# Patient Record
Sex: Male | Born: 1990 | Race: White | Hispanic: No | Marital: Married | Smoking: Never smoker
Health system: Southern US, Community
[De-identification: ages and names within clinical notes are randomized; demographics above are authoritative.]

## PROBLEM LIST (undated history)

## (undated) DIAGNOSIS — Z789 Other specified health status: Secondary | ICD-10-CM

## (undated) HISTORY — PX: APPENDECTOMY: SHX54

---
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
## 2010-03-09 ENCOUNTER — Inpatient Hospital Stay (HOSPITAL_COMMUNITY): Admission: EM | Admit: 2010-03-09 | Discharge: 2010-03-10 | Payer: Self-pay | Admitting: Emergency Medicine

## 2010-03-23 ENCOUNTER — Ambulatory Visit (HOSPITAL_COMMUNITY): Admission: EM | Admit: 2010-03-23 | Discharge: 2010-03-23 | Payer: Self-pay

## 2010-03-24 ENCOUNTER — Encounter (INDEPENDENT_AMBULATORY_CARE_PROVIDER_SITE_OTHER): Payer: Self-pay

## 2010-07-11 LAB — COMPREHENSIVE METABOLIC PANEL
ALT: 10 U/L (ref 0–53)
AST: 34 U/L (ref 0–37)
Albumin: 4.4 g/dL (ref 3.5–5.2)
BUN: 11 mg/dL (ref 6–23)
BUN: 9 mg/dL (ref 6–23)
CO2: 28 mEq/L (ref 19–32)
Calcium: 9.5 mg/dL (ref 8.4–10.5)
Calcium: 9.7 mg/dL (ref 8.4–10.5)
Chloride: 105 mEq/L (ref 96–112)
Creatinine, Ser: 1.07 mg/dL (ref 0.4–1.5)
GFR calc Af Amer: >60 mL/min (ref >60)
GFR calc non Af Amer: >60 mL/min (ref >60)
Glucose, Bld: 113 mg/dL — ABNORMAL HIGH (ref 70–99)
Sodium: 138 mEq/L (ref 135–145)
Total Bilirubin: 0.8 mg/dL (ref 0.3–1.2)
Total Protein: 7.3 g/dL (ref 6.0–8.3)

## 2010-07-11 LAB — CBC
HCT: 40 % (ref 39.0–52.0)
MCH: 32.2 pg (ref 26.0–34.0)
MCH: 32.5 pg (ref 26.0–34.0)
MCHC: 35.7 g/dL (ref 30.0–36.0)
MCHC: 36.5 g/dL — ABNORMAL HIGH (ref 30.0–36.0)
MCHC: 36.5 g/dL — ABNORMAL HIGH (ref 30.0–36.0)
MCV: 89.1 fL (ref 78.0–100.0)
MCV: 90.1 fL (ref 78.0–100.0)
Platelets: 151 10*3/uL (ref 150–400)
Platelets: 176 10*3/uL (ref 150–400)
Platelets: 196 10*3/uL (ref 150–400)
RBC: 4.35 MIL/uL (ref 4.22–5.81)
RBC: 4.95 MIL/uL (ref 4.22–5.81)
RDW: 12.3 % (ref 11.5–15.5)
RDW: 12.6 % (ref 11.5–15.5)

## 2010-07-11 LAB — URINALYSIS, ROUTINE W REFLEX MICROSCOPIC
Bilirubin Urine: NEGATIVE
Glucose, UA: NEGATIVE mg/dL
Hgb urine dipstick: NEGATIVE
Hgb urine dipstick: NEGATIVE
Ketones, ur: NEGATIVE mg/dL
Ketones, ur: NEGATIVE mg/dL
Nitrite: NEGATIVE
Protein, ur: NEGATIVE mg/dL
Protein, ur: NEGATIVE mg/dL
Urobilinogen, UA: 0.2 mg/dL (ref 0.0–1.0)

## 2010-07-11 LAB — URINE CULTURE
Colony Count: NO GROWTH
Culture  Setup Time: 201111241035

## 2010-07-11 LAB — POCT I-STAT, CHEM 8
Creatinine, Ser: 1.2 mg/dL (ref 0.4–1.5)
Glucose, Bld: 98 mg/dL (ref 70–99)
HCT: 48 % (ref 39.0–52.0)
Hemoglobin: 16.3 g/dL (ref 13.0–17.0)
TCO2: 27 mmol/L (ref 0–100)

## 2010-07-11 LAB — DIFFERENTIAL
Lymphs Abs: 1.2 10*3/uL (ref 0.7–4.0)
Monocytes Relative: 6 % (ref 3–12)
Neutro Abs: 13.6 10*3/uL — ABNORMAL HIGH (ref 1.7–7.7)
Neutrophils Relative %: 86 % — ABNORMAL HIGH (ref 43–77)

## 2010-07-11 LAB — BASIC METABOLIC PANEL
BUN: 5 mg/dL — ABNORMAL LOW (ref 6–23)
CO2: 25 mEq/L (ref 19–32)
Chloride: 103 mEq/L (ref 96–112)
Creatinine, Ser: 0.97 mg/dL (ref 0.4–1.5)
Glucose, Bld: 120 mg/dL — ABNORMAL HIGH (ref 70–99)

## 2010-07-11 LAB — LACTIC ACID, PLASMA
Lactic Acid, Venous: 1.3 mmol/L (ref 0.5–2.2)
Lactic Acid, Venous: 1.7 mmol/L (ref 0.5–2.2)

## 2010-07-11 LAB — SAMPLE TO BLOOD BANK

## 2010-07-11 LAB — PROTIME-INR: INR: 1 (ref 0.00–1.49)

## 2010-09-15 NOTE — Op Note (Signed)
NAMEAMIR, FICK              ACCOUNT NO.:  [id number]   MEDICAL RECORD NO.:  [id number]          PATIENT TYPE:  OIB   LOCATION:  2899                         FACILITY:  [facility]   PHYSICIAN:  Lanna Poche, M.D. 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   OPERATION PERFORMED:  Scleral buckle, drainage of subretinal fluid and  laser.   SURGEON:  Lanna Poche, M.D.   ASSISTANT:  1.  Albert L. Lundquist, P.A.  2.  Chari Manning.   ANESTHESIA:  General endotracheal.   ESTIMATED BLOOD LOSS:  Less than 1mL.   COMPLICATIONS:  None.   DESCRIPTION OF PROCEDURE:  The patient was taken to the operating room where  after induction of general anesthesia, the right eye was prepped and draped  in usual fashion.  A 360 degree conjunctival peritomy was performed at the  limbus with relaxing incisions at 3 and 9.  Four rectus muscles were  isolated and slung on traction sutures of 2-0 silk.  The quadrants were  inspected and found to be free of significant thinning.  Inspection with the  indirect ophthalmoscope confirmed there to be a retinal disinsertion at the  ora serrata at approximately 10 o'clock.  No additional breaks or tears were  seen and the detachment was as had been noted preoperatively encompassing  approximately two thirds of the retina with some subretinal bands nasally.  The retinal break was marked and was found to lie right adjacent to the  insertion of the superior border of the lateral rectus muscle.  An indirect  laser was used for scleral depression to perform laser photocoagulation  around the breaks as well as scattered retinal photocoagulation just  immediately posterior to the ora serrata.  The lid speculum was then removed  and two 4-0 silk traction sutures were placed, one on the upper and one on  the lower lid.  A 220 band was passed with a 240 strap, 360 degrees around  the eye with the ends joining in the superonasal quadrant with a 270 sleeve.  Sutures of 4-0 Mersilene were passed in a mattress fashion, two in each  quadrant with the exception of the supranasal quadrant where just one was  passed over the end of the buckle.  The eye was inspected and found to be  without hemorrhage with good indentation where the scleral buckle had been  loosely tied.  A quadrant was selected for drainage infratemporally.  The  buckles were retracted out of the bed and the Grieshaber sharp blade used to  incise down to the choroid.  The edges of the incision were then  diathermized lightly and then a suture of 7-0  Vicryl passed through that  area.  Using endolaser probe, penetration to the subretinal space was made  through the choroid.  Clear fluid had drained for  a few minutes and stopped  spontaneously.  The 7-0 Vicryl suture was then tied as well as the snugging  up of the sutures in the temporal quadrants.  Inspection with ophthalmoscope  showed to be drainage of most of the subretinal fluid, good approximation of  the laser treated retinal on the buckle as well as some retina posterior to  that.  Addtional laser photocoagulation was then applied to the surface of  the buckle 360 degrees.  The knots were then all permanently tied and  rotated posteriorly.  The edges of the 240 band were snugged up tightly and  then trimmed.  The traction sutures of 2-0 and 4-0 silk were then removed  and the lid speculum reintroduced.  The conjunctiva was then drawn up and  reapproximated with interrupted and running sutures of 6-0 plain gut.  The  subconjunctival space was irrigated with a mixture of antibiotic solution as  well as 0.75% Marcaine.  The  subconjunctival area was then injected with 100  mg of ceftazidime and 10 mg of Decadron.  The lid speculum was then removed.  The pressure was checked with a Barraquer tonometer and found to lie at  about 21.  Mixed antibiotic ointment was then applied on the patient's right  eye.  An eye patch and shield were then placed on the patient's eye.  Upon  waking from anesthesia, the patient left the operating room in stable  condition.      JTH/MEDQ  D:  07/05/2004  T:  07/05/2004  Job:  [id number]
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
## 2014-11-16 ENCOUNTER — Other Ambulatory Visit: Payer: Self-pay | Admitting: Orthopedic Surgery

## 2014-11-16 DIAGNOSIS — R531 Weakness: Secondary | ICD-10-CM

## 2014-11-16 DIAGNOSIS — R52 Pain, unspecified: Secondary | ICD-10-CM

## 2014-11-16 DIAGNOSIS — R609 Edema, unspecified: Secondary | ICD-10-CM

## 2014-11-19 ENCOUNTER — Other Ambulatory Visit: Payer: Self-pay

## 2018-06-18 ENCOUNTER — Other Ambulatory Visit: Payer: Self-pay | Admitting: Orthopedic Surgery

## 2018-06-20 NOTE — Pre-Procedure Instructions (Addendum)
Fadil Keysor  06/20/2018      [id number] - [place], [place] - [address] [place] [address] Phone: [phone] Fax: [phone]    Your procedure is scheduled on 06-25-2018  Wednesday   Report to [facility] Admitting at 7:30 A.M.   Call this number if you have problems the morning of surgery:  [phone]   Remember:  Do not eat food or drink liquids after midnight. :                        Take these medicines the morning of surgery with A SIP OF WATER Tramadol(Ultram) if needed  STOP TAKING ANY ASPIRIN (UNLESS OTHERWISE INSTRUCTED BY YOUR SURGEON),ANTIINFLAMATORIES (IBUPROFEN,ALEVE,MOTRIN,ADVIL,GOODY'S POWDERS),HERBAL SUPPLEMENTS,FISH OIL,AND VITAMINS 5-7 DAYS PRIOR TO SURGERY. Stop Mobic    Do not wear jewelry    Do not wear lotions, powders, or perfumes, or deodorant.   Do not shave 48 hours prior to surgery.  Men may shave face and neck.  Do not bring valuables to the hospital.  [facility] is not responsible for any belongings or valuables.  Contacts, dentures or bridgework may not be worn into surgery.  Leave your suitcase in the car.  After surgery it may be brought to your room.  For patients admitted to the hospital, discharge time will be determined by your treatment team.  Patients discharged the day of surgery will not be allowed to drive home.     [facility] - Preparing for Surgery  Before surgery, you can play an important role.  Because skin is not sterile, your skin needs to be as free of germs as possible.  You can reduce the number of germs on you skin by washing with CHG (chlorahexidine gluconate) soap before surgery.  CHG is an antiseptic cleaner which kills germs and bonds with the skin to continue killing germs even after washing.  Oral Hygiene is also important in reducing the risk of infection.  Remember to brush your teeth with your regular  toothpaste the morning of surgery.  Please DO NOT use if you have an allergy to CHG or antibacterial soaps.  If your skin becomes reddened/irritated stop using the CHG and inform your nurse when you arrive at Short Stay.  Do not shave (including legs and underarms) for at least 48 hours prior to the first CHG shower.  You may shave your face.  Please follow these instructions carefully:   1.  Shower with CHG Soap the night before surgery and the morning of Surgery.  2.  If you choose to wash your hair, wash your hair first as usual with your normal shampoo.  3.  After you shampoo, rinse your hair and body thoroughly to remove the shampoo. 4.  Use CHG as you would any other liquid soap.  You can apply chg directly to the skin and wash gently with a      scrungie or washcloth.           5.  Apply the CHG Soap to your body ONLY FROM THE NECK DOWN.   Do not use on open wounds or open sores. Avoid contact with your eyes, ears, mouth and genitals (private parts).  Wash genitals (private parts) with your normal soap.  6.  Wash thoroughly, paying special attention to the area where your surgery will be performed.  7.  Thoroughly rinse your body with warm water from the neck down.  8.  DO NOT shower/wash with your normal soap after using and rinsing off the CHG Soap.  9.  Pat yourself dry with a clean towel.            10.  Wear clean pajamas.            11.  Place clean sheets on your bed the night of your first shower and do not sleep with pets.  Day of Surgery  Do not apply any lotions/deoderants the morning of surgery.   Please wear clean clothes to the hospital/surgery center. Remember to brush your teeth with toothpaste.    Please read over the following fact sheets that you were given. Pain Booklet, Coughing and Deep Breathing and Surgical Site Infection Prevention

## 2018-06-23 ENCOUNTER — Encounter (HOSPITAL_COMMUNITY)
Admission: RE | Admit: 2018-06-23 | Discharge: 2018-06-23 | Disposition: A | Discharge status: Home / Self Care | Payer: BLUE CROSS/BLUE SHIELD | Source: Ambulatory Visit | Attending: Orthopedic Surgery | Admitting: Orthopedic Surgery

## 2018-06-23 ENCOUNTER — Encounter (HOSPITAL_COMMUNITY): Payer: Self-pay

## 2018-06-23 DIAGNOSIS — Z01812 Encounter for preprocedural laboratory examination: Secondary | ICD-10-CM

## 2018-06-23 DIAGNOSIS — M5416 Radiculopathy, lumbar region: Secondary | ICD-10-CM

## 2018-06-23 DIAGNOSIS — M79605 Pain in left leg: Secondary | ICD-10-CM | POA: Diagnosis present

## 2018-06-23 DIAGNOSIS — Z791 Long term (current) use of non-steroidal anti-inflammatories (NSAID): Secondary | ICD-10-CM | POA: Diagnosis not present

## 2018-06-23 DIAGNOSIS — M5116 Intervertebral disc disorders with radiculopathy, lumbar region: Secondary | ICD-10-CM | POA: Diagnosis not present

## 2018-06-23 HISTORY — DX: Other specified health status: Z78.9

## 2018-06-23 LAB — CBC WITH DIFFERENTIAL/PLATELET
Abs Immature Granulocytes: 0.01 10*3/uL (ref 0.00–0.07)
BASOS PCT: 0 %
Basophils Absolute: 0 10*3/uL (ref 0.0–0.1)
Eosinophils Absolute: 0 10*3/uL (ref 0.0–0.5)
Eosinophils Relative: 0 %
HCT: 44.9 % (ref 39.0–52.0)
Hemoglobin: 16 g/dL (ref 13.0–17.0)
Immature Granulocytes: 0 %
Lymphocytes Relative: 30 %
Lymphs Abs: 1.6 10*3/uL (ref 0.7–4.0)
MCH: 31.3 pg (ref 26.0–34.0)
MCHC: 35.6 g/dL (ref 30.0–36.0)
MCV: 87.7 fL (ref 80.0–100.0)
Monocytes Absolute: 0.4 10*3/uL (ref 0.1–1.0)
Monocytes Relative: 7 %
Neutro Abs: 3.4 10*3/uL (ref 1.7–7.7)
Neutrophils Relative %: 63 %
Platelets: 188 10*3/uL (ref 150–400)
RBC: 5.12 MIL/uL (ref 4.22–5.81)
RDW: 12 % (ref 11.5–15.5)
WBC: 5.3 10*3/uL (ref 4.0–10.5)
nRBC: 0 % (ref 0.0–0.2)

## 2018-06-23 LAB — COMPREHENSIVE METABOLIC PANEL
ALT: 19 U/L (ref 0–44)
AST: 20 U/L (ref 15–41)
Albumin: 4.3 g/dL (ref 3.5–5.0)
Alkaline Phosphatase: 43 U/L (ref 38–126)
Anion gap: 9 (ref 5–15)
BUN: 11 mg/dL (ref 6–20)
CO2: 26 mmol/L (ref 22–32)
CREATININE: 1 mg/dL (ref 0.61–1.24)
Calcium: 9.8 mg/dL (ref 8.9–10.3)
Chloride: 103 mmol/L (ref 98–111)
GFR calc Af Amer: >60 mL/min (ref >60)
GFR calc non Af Amer: >60 mL/min (ref >60)
Glucose, Bld: 140 mg/dL — ABNORMAL HIGH (ref 70–99)
Potassium: 3.9 mmol/L (ref 3.5–5.1)
SODIUM: 138 mmol/L (ref 135–145)
Total Bilirubin: 0.9 mg/dL (ref 0.3–1.2)
Total Protein: 7 g/dL (ref 6.5–8.1)

## 2018-06-23 LAB — SURGICAL PCR SCREEN
MRSA, PCR: NEGATIVE
Staphylococcus aureus: NEGATIVE

## 2018-06-23 LAB — URINALYSIS, ROUTINE W REFLEX MICROSCOPIC
Bilirubin Urine: NEGATIVE
Glucose, UA: NEGATIVE mg/dL
Hgb urine dipstick: NEGATIVE
Ketones, ur: NEGATIVE mg/dL
LEUKOCYTE UA: NEGATIVE
Nitrite: NEGATIVE
Protein, ur: NEGATIVE mg/dL
Specific Gravity, Urine: 1.015 (ref 1.005–1.030)
pH: 7.5 (ref 5.0–8.0)

## 2018-06-23 LAB — TYPE AND SCREEN
ABO/RH(D): O POS
Antibody Screen: NEGATIVE

## 2018-06-23 LAB — APTT: APTT: 32 s (ref 24–36)

## 2018-06-23 LAB — PROTIME-INR
INR: 1.1
Prothrombin Time: 13.6 seconds (ref 11.4–15.2)

## 2018-06-24 LAB — ABO/RH: ABO/RH(D): O POS
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
## 2018-06-25 ENCOUNTER — Ambulatory Visit (HOSPITAL_COMMUNITY): Payer: BLUE CROSS/BLUE SHIELD

## 2018-06-25 ENCOUNTER — Other Ambulatory Visit: Payer: Self-pay

## 2018-06-25 ENCOUNTER — Encounter (HOSPITAL_COMMUNITY): Payer: Self-pay | Admitting: *Deleted

## 2018-06-25 ENCOUNTER — Ambulatory Visit (HOSPITAL_COMMUNITY): Payer: BLUE CROSS/BLUE SHIELD | Admitting: Anesthesiology

## 2018-06-25 ENCOUNTER — Ambulatory Visit (HOSPITAL_COMMUNITY)
Admission: RE | Admit: 2018-06-25 | Discharge: 2018-06-25 | Disposition: A | Discharge status: Home / Self Care | Payer: BLUE CROSS/BLUE SHIELD | Attending: Orthopedic Surgery | Admitting: Orthopedic Surgery

## 2018-06-25 ENCOUNTER — Ambulatory Visit (HOSPITAL_COMMUNITY)
Admission: RE | Disposition: A | Discharge status: Home / Self Care | Payer: Self-pay | Source: Home / Self Care | Attending: Orthopedic Surgery

## 2018-06-25 DIAGNOSIS — Z419 Encounter for procedure for purposes other than remedying health state, unspecified: Secondary | ICD-10-CM

## 2018-06-25 DIAGNOSIS — M5116 Intervertebral disc disorders with radiculopathy, lumbar region: Secondary | ICD-10-CM | POA: Insufficient documentation

## 2018-06-25 DIAGNOSIS — Z791 Long term (current) use of non-steroidal anti-inflammatories (NSAID): Secondary | ICD-10-CM | POA: Insufficient documentation

## 2018-06-25 HISTORY — PX: LUMBAR LAMINECTOMY/DECOMPRESSION MICRODISCECTOMY: SHX5026

## 2018-06-25 SURGERY — LUMBAR LAMINECTOMY/DECOMPRESSION MICRODISCECTOMY
Anesthesia: General | Site: Spine Lumbar | Laterality: Left

## 2018-06-25 MED ORDER — SUGAMMADEX SODIUM 200 MG/2ML IV SOLN
INTRAVENOUS | Status: DC | PRN
  Administered 2018-06-25: 250 mg via INTRAVENOUS

## 2018-06-25 MED ORDER — PROPOFOL 10 MG/ML IV BOLUS
INTRAVENOUS | Status: AC
  Filled 2018-06-25: qty 40

## 2018-06-25 MED ORDER — ACETAMINOPHEN 10 MG/ML IV SOLN
INTRAVENOUS | Status: AC
  Filled 2018-06-25: qty 100

## 2018-06-25 MED ORDER — PROMETHAZINE HCL 25 MG/ML IJ SOLN: 6.2500 mg | INTRAMUSCULAR | Status: DC | PRN

## 2018-06-25 MED ORDER — OXYCODONE-ACETAMINOPHEN 5-325 MG PO TABS: 1.0000 | ORAL_TABLET | ORAL | 0 refills | Status: AC | PRN

## 2018-06-25 MED ORDER — PHENYLEPHRINE 40 MCG/ML (10ML) SYRINGE FOR IV PUSH (FOR BLOOD PRESSURE SUPPORT)
PREFILLED_SYRINGE | INTRAVENOUS | Status: DC | PRN
  Administered 2018-06-25: 80 ug via INTRAVENOUS

## 2018-06-25 MED ORDER — MIDAZOLAM HCL 5 MG/5ML IJ SOLN
INTRAMUSCULAR | Status: DC | PRN
  Administered 2018-06-25: 2 mg via INTRAVENOUS

## 2018-06-25 MED ORDER — ROCURONIUM BROMIDE 100 MG/10ML IV SOLN: INTRAVENOUS | Status: DC | PRN

## 2018-06-25 MED ORDER — HYDROMORPHONE HCL 1 MG/ML IJ SOLN
0.2500 mg | INTRAMUSCULAR | Status: DC | PRN
  Administered 2018-06-25: 0.5 mg via INTRAVENOUS

## 2018-06-25 MED ORDER — ONDANSETRON HCL 4 MG/2ML IJ SOLN
INTRAMUSCULAR | Status: DC | PRN
  Administered 2018-06-25: 4 mg via INTRAVENOUS

## 2018-06-25 MED ORDER — 0.9 % SODIUM CHLORIDE (POUR BTL) OPTIME
TOPICAL | Status: DC | PRN
  Administered 2018-06-25: 1000 mL

## 2018-06-25 MED ORDER — PROPOFOL 10 MG/ML IV BOLUS
INTRAVENOUS | Status: DC | PRN
  Administered 2018-06-25: 200 mg via INTRAVENOUS

## 2018-06-25 MED ORDER — ACETAMINOPHEN 10 MG/ML IV SOLN
1000.0000 mg | Freq: Once | INTRAVENOUS | Status: DC | PRN
  Administered 2018-06-25: 1000 mg via INTRAVENOUS

## 2018-06-25 MED ORDER — HYDROMORPHONE HCL 1 MG/ML IJ SOLN
INTRAMUSCULAR | Status: AC
  Filled 2018-06-25: qty 1

## 2018-06-25 MED ORDER — CEFAZOLIN SODIUM-DEXTROSE 2-4 GM/100ML-% IV SOLN
2.0000 g | INTRAVENOUS | Status: AC
  Administered 2018-06-25: 2 g via INTRAVENOUS
  Filled 2018-06-25: qty 100

## 2018-06-25 MED ORDER — BUPIVACAINE-EPINEPHRINE 0.25% -1:200000 IJ SOLN
INTRAMUSCULAR | Status: DC | PRN
  Administered 2018-06-25: 8 mL

## 2018-06-25 MED ORDER — POVIDONE-IODINE 7.5 % EX SOLN
Freq: Once | CUTANEOUS | Status: DC
  Filled 2018-06-25: qty 118

## 2018-06-25 MED ORDER — MIDAZOLAM HCL 2 MG/2ML IJ SOLN
INTRAMUSCULAR | Status: AC
  Filled 2018-06-25: qty 2

## 2018-06-25 MED ORDER — LACTATED RINGERS IV SOLN
INTRAVENOUS | Status: DC | PRN
  Administered 2018-06-25 (×2): via INTRAVENOUS

## 2018-06-25 MED ORDER — LIDOCAINE 2% (20 MG/ML) 5 ML SYRINGE
INTRAMUSCULAR | Status: DC | PRN
  Administered 2018-06-25: 40 mg via INTRAVENOUS
  Administered 2018-06-25: 60 mg via INTRAVENOUS

## 2018-06-25 MED ORDER — BUPIVACAINE-EPINEPHRINE (PF) 0.5% -1:200000 IJ SOLN
INTRAMUSCULAR | Status: AC
  Filled 2018-06-25: qty 30

## 2018-06-25 MED ORDER — THROMBIN 20000 UNITS EX SOLR
CUTANEOUS | Status: AC
  Filled 2018-06-25: qty 20000

## 2018-06-25 MED ORDER — ROCURONIUM BROMIDE 50 MG/5ML IV SOSY
PREFILLED_SYRINGE | INTRAVENOUS | Status: DC | PRN
  Administered 2018-06-25: 20 mg via INTRAVENOUS
  Administered 2018-06-25 (×2): 10 mg via INTRAVENOUS
  Administered 2018-06-25: 50 mg via INTRAVENOUS

## 2018-06-25 MED ORDER — OXYCODONE HCL 5 MG PO TABS
ORAL_TABLET | ORAL | Status: AC
  Filled 2018-06-25: qty 1

## 2018-06-25 MED ORDER — METHYLPREDNISOLONE ACETATE 40 MG/ML IJ SUSP
INTRAMUSCULAR | Status: DC | PRN
  Administered 2018-06-25: 40 mg

## 2018-06-25 MED ORDER — BUPIVACAINE LIPOSOME 1.3 % IJ SUSP
20.0000 mL | INTRAMUSCULAR | Status: AC
  Administered 2018-06-25: 20 mL
  Filled 2018-06-25: qty 20

## 2018-06-25 MED ORDER — SUFENTANIL CITRATE 50 MCG/ML IV SOLN
INTRAVENOUS | Status: AC
  Filled 2018-06-25: qty 1

## 2018-06-25 MED ORDER — THROMBIN 20000 UNITS EX SOLR
CUTANEOUS | Status: DC | PRN
  Administered 2018-06-25 (×2): 20 mL via TOPICAL

## 2018-06-25 MED ORDER — LACTATED RINGERS IV SOLN
Freq: Once | INTRAVENOUS | Status: AC
  Administered 2018-06-25: 08:00:00 via INTRAVENOUS

## 2018-06-25 MED ORDER — METHYLPREDNISOLONE ACETATE 40 MG/ML IJ SUSP
INTRAMUSCULAR | Status: AC
  Filled 2018-06-25: qty 1

## 2018-06-25 MED ORDER — METHYLENE BLUE 0.5 % INJ SOLN
INTRAVENOUS | Status: DC | PRN
  Administered 2018-06-25: 5 mL

## 2018-06-25 MED ORDER — METHYLENE BLUE 0.5 % INJ SOLN
INTRAVENOUS | Status: AC
  Filled 2018-06-25: qty 10

## 2018-06-25 MED ORDER — SUFENTANIL CITRATE 50 MCG/ML IV SOLN
INTRAVENOUS | Status: DC | PRN
  Administered 2018-06-25 (×4): 5 ug via INTRAVENOUS

## 2018-06-25 MED ORDER — OXYCODONE HCL 5 MG/5ML PO SOLN: 5.0000 mg | Freq: Once | ORAL | Status: AC | PRN

## 2018-06-25 MED ORDER — THROMBIN 5000 UNITS EX SOLR
CUTANEOUS | Status: AC
  Filled 2018-06-25: qty 5000

## 2018-06-25 MED ORDER — OXYCODONE HCL 5 MG PO TABS
5.0000 mg | ORAL_TABLET | Freq: Once | ORAL | Status: AC | PRN
  Administered 2018-06-25: 5 mg via ORAL

## 2018-06-25 MED ORDER — DIAZEPAM 5 MG PO TABS: 5.0000 mg | ORAL_TABLET | Freq: Three times a day (TID) | ORAL | 0 refills | Status: AC | PRN

## 2018-06-25 SURGICAL SUPPLY — 77 items
BENZOIN TINCTURE PRP APPL 2/3 (GAUZE/BANDAGES/DRESSINGS) ×3 IMPLANT
BUR PRECISION FLUTE 5.0 (BURR) ×3 IMPLANT
CABLE BIPOLOR RESECTION CORD (MISCELLANEOUS) IMPLANT
CANISTER SUCT 3000ML PPV (MISCELLANEOUS) ×3 IMPLANT
CARTRIDGE OIL MAESTRO DRILL (MISCELLANEOUS) ×1 IMPLANT
CLOSURE STERI-STRIP 1/2X4 (GAUZE/BANDAGES/DRESSINGS) ×1
CLOSURE WOUND 1/2 X4 (GAUZE/BANDAGES/DRESSINGS) ×1
CLSR STERI-STRIP ANTIMIC 1/2X4 (GAUZE/BANDAGES/DRESSINGS) ×2 IMPLANT
COVER SURGICAL LIGHT HANDLE (MISCELLANEOUS) IMPLANT
COVER WAND RF STERILE (DRAPES) ×3 IMPLANT
DIFFUSER DRILL AIR PNEUMATIC (MISCELLANEOUS) ×3 IMPLANT
DRAIN CHANNEL 15F RND FF W/TCR (WOUND CARE) IMPLANT
DRAPE POUCH INSTRU U-SHP 10X18 (DRAPES) ×6 IMPLANT
DRAPE SURG 17X23 STRL (DRAPES) ×12 IMPLANT
DURAPREP 26ML APPLICATOR (WOUND CARE) ×3 IMPLANT
ELECT BLADE 4.0 EZ CLEAN MEGAD (MISCELLANEOUS) ×3
ELECT CAUTERY BLADE 6.4 (BLADE) ×3 IMPLANT
ELECT REM PT RETURN 9FT ADLT (ELECTROSURGICAL) ×3
ELECTRODE BLDE 4.0 EZ CLN MEGD (MISCELLANEOUS) ×1 IMPLANT
ELECTRODE REM PT RTRN 9FT ADLT (ELECTROSURGICAL) ×1 IMPLANT
EVACUATOR SILICONE 100CC (DRAIN) IMPLANT
FILTER STRAW FLUID ASPIR (MISCELLANEOUS) ×3 IMPLANT
GAUZE 4X4 16PLY RFD (DISPOSABLE) ×3 IMPLANT
GAUZE SPONGE 4X4 12PLY STRL (GAUZE/BANDAGES/DRESSINGS) IMPLANT
GAUZE SPONGE 4X4 12PLY STRL LF (GAUZE/BANDAGES/DRESSINGS) ×3 IMPLANT
GLOVE BIO SURGEON STRL SZ7 (GLOVE) ×6 IMPLANT
GLOVE BIO SURGEON STRL SZ8 (GLOVE) ×3 IMPLANT
GLOVE BIOGEL PI IND STRL 6.5 (GLOVE) ×2 IMPLANT
GLOVE BIOGEL PI IND STRL 7.0 (GLOVE) ×2 IMPLANT
GLOVE BIOGEL PI IND STRL 8 (GLOVE) ×1 IMPLANT
GLOVE BIOGEL PI INDICATOR 6.5 (GLOVE) ×4
GLOVE BIOGEL PI INDICATOR 7.0 (GLOVE) ×4
GLOVE BIOGEL PI INDICATOR 8 (GLOVE) ×2
GLOVE SURG SS PI 6.0 STRL IVOR (GLOVE) ×12 IMPLANT
GOWN STRL REUS W/ TWL LRG LVL3 (GOWN DISPOSABLE) ×3 IMPLANT
GOWN STRL REUS W/ TWL XL LVL3 (GOWN DISPOSABLE) ×1 IMPLANT
GOWN STRL REUS W/TWL LRG LVL3 (GOWN DISPOSABLE) ×6
GOWN STRL REUS W/TWL XL LVL3 (GOWN DISPOSABLE) ×2
HEMOSTAT POWDER KIT SURGIFOAM (HEMOSTASIS) ×3 IMPLANT
IV CATH 14GX2 1/4 (CATHETERS) ×3 IMPLANT
KIT BASIN OR (CUSTOM PROCEDURE TRAY) ×3 IMPLANT
KIT POSITION SURG JACKSON T1 (MISCELLANEOUS) ×3 IMPLANT
KIT TURNOVER KIT B (KITS) ×3 IMPLANT
NEEDLE 18GX1X1/2 (RX/OR ONLY) (NEEDLE) ×3 IMPLANT
NEEDLE 22X1 1/2 (OR ONLY) (NEEDLE) ×3 IMPLANT
NEEDLE HYPO 25GX1X1/2 BEV (NEEDLE) ×3 IMPLANT
NEEDLE SPNL 18GX3.5 QUINCKE PK (NEEDLE) ×6 IMPLANT
NS IRRIG 1000ML POUR BTL (IV SOLUTION) ×3 IMPLANT
OIL CARTRIDGE MAESTRO DRILL (MISCELLANEOUS) ×3
PACK LAMINECTOMY ORTHO (CUSTOM PROCEDURE TRAY) ×3 IMPLANT
PACK UNIVERSAL I (CUSTOM PROCEDURE TRAY) ×3 IMPLANT
PAD ARMBOARD 7.5X6 YLW CONV (MISCELLANEOUS) ×9 IMPLANT
PATTIES SURGICAL .5 X.5 (GAUZE/BANDAGES/DRESSINGS) IMPLANT
PATTIES SURGICAL .5 X1 (DISPOSABLE) ×3 IMPLANT
SPONGE INTESTINAL PEANUT (DISPOSABLE) ×3 IMPLANT
SPONGE SURGIFOAM ABS GEL 100 (HEMOSTASIS) ×3 IMPLANT
SPONGE SURGIFOAM ABS GEL SZ50 (HEMOSTASIS) IMPLANT
STRIP CLOSURE SKIN 1/2X4 (GAUZE/BANDAGES/DRESSINGS) ×2 IMPLANT
SURGIFLO W/THROMBIN 8M KIT (HEMOSTASIS) IMPLANT
SUT MNCRL AB 4-0 PS2 18 (SUTURE) ×3 IMPLANT
SUT VIC AB 0 CT1 18XCR BRD 8 (SUTURE) IMPLANT
SUT VIC AB 0 CT1 27 (SUTURE)
SUT VIC AB 0 CT1 27XBRD ANBCTR (SUTURE) IMPLANT
SUT VIC AB 0 CT1 8-18 (SUTURE)
SUT VIC AB 1 CT1 18XCR BRD 8 (SUTURE) ×1 IMPLANT
SUT VIC AB 1 CT1 8-18 (SUTURE) ×2
SUT VIC AB 2-0 CT2 18 VCP726D (SUTURE) ×3 IMPLANT
SYR 20CC LL (SYRINGE) ×3 IMPLANT
SYR BULB IRRIGATION 50ML (SYRINGE) ×3 IMPLANT
SYR CONTROL 10ML LL (SYRINGE) ×6 IMPLANT
SYR TB 1ML 26GX3/8 SAFETY (SYRINGE) ×6 IMPLANT
SYR TB 1ML LUER SLIP (SYRINGE) ×6 IMPLANT
TAPE CLOTH SURG 4X10 WHT LF (GAUZE/BANDAGES/DRESSINGS) ×3 IMPLANT
TOWEL GREEN STERILE (TOWEL DISPOSABLE) ×3 IMPLANT
TOWEL GREEN STERILE FF (TOWEL DISPOSABLE) ×3 IMPLANT
WATER STERILE IRR 1000ML POUR (IV SOLUTION) ×3 IMPLANT
YANKAUER SUCT BULB TIP NO VENT (SUCTIONS) ×3 IMPLANT

## 2018-06-25 NOTE — H&P (Signed)
     PREOPERATIVE H&P  Chief Complaint: Left leg pain  HPI: Jon Benitez is a 28 y.o. 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 Medical history non-contributory     Social History   Socioeconomic History  . Marital status: Married    Spouse name: Not on file  . Number of children: Not on file  . Years of education: Not on file  . Highest education level: Not on file  Occupational History  . Not on file  Social Needs  . Financial resource strain: Not on file  . Food insecurity:    Worry: Not on file    Inability: Not on file  . Transportation needs:    Medical: Not on file    Non-medical: Not on file  Tobacco Use  . Smoking status: Never Smoker  Substance and Sexual Activity  . Alcohol use: Never    Frequency: Never  . Drug use: Never  . Sexual activity: Not on file  Lifestyle  . Physical activity:    Days per week: Not on file    Minutes per session: Not on file  . Stress: Not on file  Relationships  . Social connections:    Talks on phone: Not on file    Gets together: Not on file    Attends religious service: Not on file    Active member of club or organization: Not on file    Attends meetings of clubs or organizations: Not on file    Relationship status: Not on file  Other Topics Concern  . Not on file  Social History Narrative  . Not on file   No family history on file. No Known Allergies Prior to Admission medications   Medication Sig Start Date End Date Taking? Authorizing Provider  meloxicam (MOBIC) 15 MG tablet Take 15 mg by mouth daily after lunch.   Yes [provider]  traMADol (ULTRAM) 50 MG tablet Take 100 mg by mouth every evening.   Yes [provider]      All other systems have been reviewed and were otherwise negative with the exception of those mentioned in the HPI and as above.  Physical Exam: There were no vitals filed for this visit.  There is no height or weight on file to calculate BMI.  General: Alert, no acute distress Cardiovascular: No pedal edema Respiratory: No cyanosis, no use of accessory musculature Skin: No lesions in the area of chief complaint Neurologic: Sensation intact distally Psychiatric: Patient is competent for consent with normal mood and affect Lymphatic: No axillary or cervical lymphadenopathy  MUSCULOSKELETAL: + SLR on the left  Assessment/Plan: ONGOING LEFT LUMBAR 5 RADICULOPATHY Plan for Procedure(s): LEFT-SIDED LUMBAR 4-5 MICRODISECTOMY   Jackelyn Hoehn, MD 06/25/2018 6:46 AM

## 2018-06-25 NOTE — Transfer of Care (Addendum)
Immediate Anesthesia Transfer of Care Note  Patient: Jon Benitez  Procedure(s) Performed: LEFT-SIDED LUMBAR FOUR-FIVE MICRODISECTOMY (Left Spine Lumbar)  Patient Location: PACU  Anesthesia Type:General  Level of Consciousness: awake and alert   Airway & Oxygen Therapy: Patient Spontanous Breathing and Patient connected to nasal cannula oxygen  Post-op Assessment: Report given to RN and Post -op Vital signs reviewed and stable  Post vital signs: Reviewed and stable  Last Vitals:  Vitals Value Taken Time  BP    Temp    Pulse    Resp    SpO2      Last Pain:  Vitals:   06/25/18 0745  TempSrc:   PainSc: 5       Patients Stated Pain Goal: 3 (06/25/18 0745)  Complications: No apparent anesthesia complications

## 2018-06-25 NOTE — Addendum Note (Signed)
Addendum  created 06/25/18 1811 by Gwenyth Allegra, CRNA   Charge Capture section accepted, Intraprocedure Event edited

## 2018-06-25 NOTE — Anesthesia Procedure Notes (Signed)
Procedure Name: Intubation Date/Time: 06/25/2018 10:39 AM Performed by: Eligha Bridegroom, CRNA Pre-anesthesia Checklist: Patient identified, Emergency Drugs available, Suction available and Patient being monitored Patient Re-evaluated:Patient Re-evaluated prior to induction Oxygen Delivery Method: Circle system utilized Preoxygenation: Pre-oxygenation with 100% oxygen Induction Type: IV induction and Cricoid Pressure applied Ventilation: Mask ventilation without difficulty Laryngoscope Size: Mac and 4 Grade View: Grade II Tube type: Oral Tube size: 7.5 mm Number of attempts: 1 Airway Equipment and Method: Stylet Placement Confirmation: ETT inserted through vocal cords under direct vision,  positive ETCO2 and breath sounds checked- equal and bilateral Secured at: 23 cm Tube secured with: Tape Dental Injury: Teeth and Oropharynx as per pre-operative assessment

## 2018-06-25 NOTE — Anesthesia Postprocedure Evaluation (Signed)
Anesthesia Post Note  Patient: Demarquez Hamlyn  Procedure(s) Performed: LEFT-SIDED LUMBAR FOUR-FIVE MICRODISECTOMY (Left Spine Lumbar)     Patient location during evaluation: PACU Anesthesia Type: General Level of consciousness: awake and alert Pain management: pain level controlled Vital Signs Assessment: post-procedure vital signs reviewed and stable Respiratory status: spontaneous breathing, nonlabored ventilation and respiratory function stable Cardiovascular status: blood pressure returned to baseline and stable Postop Assessment: no apparent nausea or vomiting Anesthetic complications: no    Last Vitals:  Vitals:   06/25/18 1330 06/25/18 1341  BP: 133/87 (!) 141/67  Pulse: 76 83  Resp: 14 16  Temp:  36.7 C  SpO2: 96% 99%    Last Pain:  Vitals:   06/25/18 1341  TempSrc:   PainSc: 2                  Kaylyn Layer

## 2018-06-25 NOTE — Op Note (Signed)
NAME:  Jon Benitez               MEDICAL RECORD NO.:  [id number]             PHYSICIAN:  Estill Bamberg, MD      DATE OF BIRTH:  [birth year]                   DATE OF PROCEDURE:  06/25/2018                                         OPERATIVE REPORT     PREOPERATIVE DIAGNOSES: 1. Left-sided L5 radiculopathy. 2. Left-sided L4/5 disk herniation causing      compression of the left L5 nerve.   POSTOPERATIVE DIAGNOSES: 1. Left-sided L5 radiculopathy. 2. Left-sided L4/5 disk herniation causing      compression of the left L5 nerve.   PROCEDURES:  Left-sided L4/5 laminotomy with partial facetectomy and removal of herniated left-sided L4/5 disk fragment.   SURGEON:  Estill Bamberg, MD.   ASSISTANTJason Coop, PA-C.   ANESTHESIA:  General endotracheal anesthesia.   COMPLICATIONS:  None.   DISPOSITION:  Stable.   ESTIMATED BLOOD LOSS:  Minimal.   INDICATIONS FOR SURGERY:  Briefly, Jon Benitez is a pleasant 28 year old male, who did present to me with severe pain in the left leg.  The patient's MRI did reveal the findings outlined above.  Extensive work-up was performed in order to ensure that his pain was in fact coming from the herniation noted above. His work-up did strongly suggest that his pain was coming from the L4-5 herniated disc fragment.  As such, we did ultimately elect to proceed with the procedure reflected above.  The patient was fully made aware of the risks of surgery, including the risk of recurrent herniation and the need for subsequent surgery, including the possibility of a subsequent diskectomy and/or fusion. Of note, the patient has had symptoms for almost 1 year.   OPERATIVE DETAILS:  On 06/25/2018, the patient was brought to surgery and general endotracheal anesthesia was administered.  The patient was placed prone on a well-padded flat Jackson bed with a spinal frame.  Antibiotics were given.  The back was prepped and draped and a time-out procedure was performed.   At this point, a midline incision was made directly over the L4/5 intervertebral space.  A curvilinear incision was made just to the left of the midline into the fascia.  A self-retaining McCulloch retractor was placed.  The lamina of L4 and L5 was identified and subperiosteally exposed.  I then removed the lateral aspect of the L4/5 ligamentum flavum.  Readily identified was the traversing left L5 nerve, which was noted to be under obvious tension, and was noted to be rather erythematous.  I was able to gently gain medial retraction of the nerve, and in doing so, a large herniated disk fragment was readily noted. An annulotomy was performed, and herniated disc material was removed in multiple fragments. At this point, the traversing left L5 nerve was evaluated, and was noted to be free and mobile, and entirely free of any compression. I was very pleased with the final decompression that I was able to accomplish.  At this point, the wound was copiously irrigated with normal saline.  All epidural bleeding was controlled using bipolar electrocautery in addition to Surgiflo. All bleeding was controlled at the termination of  the procedure.  At this point, 30 mg of Depo-Medrol was introduced about the epidural space in the region of the left L5 nerve.  The wound was then closed in layers using #1 Vicryl followed by 2-0 Vicryl, followed by 4-0 Monocryl. Benzoin and Steri-Strips were applied followed by a sterile dressing. All instrument counts were correct at the termination of the procedure.   Of note, Jason Coop was my assistant throughout surgery, and did aid in retraction, suctioning, and closure from start to finish.         Estill Bamberg, MD

## 2018-06-26 ENCOUNTER — Encounter (HOSPITAL_COMMUNITY): Payer: Self-pay | Admitting: Orthopedic Surgery

## 2018-06-26 MED FILL — Thrombin For Soln 5000 Unit: CUTANEOUS | Qty: 5000 | Status: AC

## 2018-06-26 MED FILL — Gelatin Absorbable MT Powder: OROMUCOSAL | Qty: 1 | Status: AC

## 2019-03-03 ENCOUNTER — Emergency Department (HOSPITAL_COMMUNITY)
Admission: EM | Admit: 2019-03-03 | Discharge: 2019-03-03 | Disposition: A | Discharge status: Home / Self Care | Payer: BC Managed Care – PPO | Attending: Emergency Medicine | Admitting: Emergency Medicine

## 2019-03-03 ENCOUNTER — Other Ambulatory Visit: Payer: Self-pay

## 2019-03-03 ENCOUNTER — Encounter (HOSPITAL_COMMUNITY): Payer: Self-pay | Admitting: Emergency Medicine

## 2019-03-03 DIAGNOSIS — R1110 Vomiting, unspecified: Secondary | ICD-10-CM | POA: Insufficient documentation

## 2019-03-03 DIAGNOSIS — R1013 Epigastric pain: Secondary | ICD-10-CM | POA: Diagnosis not present

## 2019-03-03 DIAGNOSIS — R1010 Upper abdominal pain, unspecified: Secondary | ICD-10-CM

## 2019-03-03 LAB — CBC WITH DIFFERENTIAL/PLATELET
Abs Immature Granulocytes: 0.01 10*3/uL (ref 0.00–0.07)
Basophils Absolute: 0 10*3/uL (ref 0.0–0.1)
Basophils Relative: 0 %
Eosinophils Absolute: 0 10*3/uL (ref 0.0–0.5)
Eosinophils Relative: 0 %
HCT: 43.9 % (ref 39.0–52.0)
Hemoglobin: 15.4 g/dL (ref 13.0–17.0)
Immature Granulocytes: 0 %
Lymphocytes Relative: 28 %
Lymphs Abs: 1.4 10*3/uL (ref 0.7–4.0)
MCH: 31.7 pg (ref 26.0–34.0)
MCHC: 35.1 g/dL (ref 30.0–36.0)
MCV: 90.3 fL (ref 80.0–100.0)
Monocytes Absolute: 0.4 10*3/uL (ref 0.1–1.0)
Monocytes Relative: 8 %
Neutro Abs: 3.1 10*3/uL (ref 1.7–7.7)
Neutrophils Relative %: 64 %
Platelets: 203 10*3/uL (ref 150–400)
RBC: 4.86 MIL/uL (ref 4.22–5.81)
RDW: 12.2 % (ref 11.5–15.5)
WBC: 4.8 10*3/uL (ref 4.0–10.5)
nRBC: 0 % (ref 0.0–0.2)

## 2019-03-03 LAB — COMPREHENSIVE METABOLIC PANEL
ALT: 20 U/L (ref 0–44)
AST: 30 U/L (ref 15–41)
Albumin: 4.1 g/dL (ref 3.5–5.0)
Alkaline Phosphatase: 68 U/L (ref 38–126)
Anion gap: 9 (ref 5–15)
BUN: 10 mg/dL (ref 6–20)
CO2: 23 mmol/L (ref 22–32)
Calcium: 9.3 mg/dL (ref 8.9–10.3)
Chloride: 108 mmol/L (ref 98–111)
Creatinine, Ser: 0.99 mg/dL (ref 0.61–1.24)
GFR calc Af Amer: >60 mL/min (ref >60)
GFR calc non Af Amer: >60 mL/min (ref >60)
Glucose, Bld: 120 mg/dL — ABNORMAL HIGH (ref 70–99)
Potassium: 3.9 mmol/L (ref 3.5–5.1)
Sodium: 140 mmol/L (ref 135–145)
Total Bilirubin: 0.6 mg/dL (ref 0.3–1.2)
Total Protein: 6.8 g/dL (ref 6.5–8.1)

## 2019-03-03 LAB — LIPASE, BLOOD: Lipase: 29 U/L (ref 11–51)

## 2019-03-03 MED ORDER — PANTOPRAZOLE SODIUM 20 MG PO TBEC: 20.0000 mg | DELAYED_RELEASE_TABLET | Freq: Every day | ORAL | 0 refills | Status: AC

## 2019-03-03 MED ORDER — SUCRALFATE 1 G PO TABS: 1.0000 g | ORAL_TABLET | Freq: Four times a day (QID) | ORAL | 0 refills | Status: AC

## 2019-03-03 MED ORDER — ONDANSETRON HCL 4 MG/2ML IJ SOLN
4.0000 mg | Freq: Once | INTRAMUSCULAR | Status: AC
  Administered 2019-03-03: 07:00:00 4 mg via INTRAVENOUS
  Filled 2019-03-03: qty 2

## 2019-03-03 MED ORDER — ALUM & MAG HYDROXIDE-SIMETH 200-200-20 MG/5ML PO SUSP
30.0000 mL | Freq: Once | ORAL | Status: AC
  Administered 2019-03-03: 30 mL via ORAL
  Filled 2019-03-03: qty 30

## 2019-03-03 NOTE — ED Provider Notes (Signed)
Patient signed to me by Dr. Dina Rich pending labs which were negative.  Patient feels better after meds here and will be discharged home   Lacretia Leigh, MD 03/03/19 [phone]

## 2019-03-03 NOTE — ED Provider Notes (Signed)
[facility] EMERGENCY DEPARTMENT Provider Note   CSN: [id number] Arrival date & time: 03/03/19  7902     History   Chief Complaint Chief Complaint  Patient presents with  . Abdominal Pain    HPI Jon Benitez is a 28 y.o. male.     HPI  This is a 28 year old male with a history of appendectomy who presents with abdominal pain and vomiting.  Patient reports he started having some epigastric abdominal pain last night.  At times it is sharp.  It waxes and wanes.  He took some Tums with minimal relief.  He subsequently vomited.  He has had 2 episodes of vomiting.  Currently he states he is fairly comfortable but at times his pain is 7 or 8 out of 10.  He denies any diarrhea.  He has never had any pain like this before.  Denies any fevers or recent illnesses.  Past Medical History:  Diagnosis Date  . Medical history non-contributory     There are no active problems to display for this patient.   Past Surgical History:  Procedure Laterality Date  . APPENDECTOMY    . LUMBAR LAMINECTOMY/DECOMPRESSION MICRODISCECTOMY Left 06/25/2018   Procedure: LEFT-SIDED LUMBAR FOUR-FIVE MICRODISECTOMY;  Surgeon: Estill Bamberg, MD;  Location: [facility] OR;  Service: Orthopedics;  Laterality: Left;        Home Medications    Prior to Admission medications   Medication Sig Start Date End Date Taking? Authorizing Provider  diazepam (VALIUM) 5 MG tablet Take 1 tablet (5 mg total) by mouth every 8 (eight) hours as needed for anxiety. Patient not taking: Reported on 03/03/2019 06/25/18 06/25/19  Georga Bora, PA-C    Family History No family history on file.  Social History Social History   Tobacco Use  . Smoking status: Never Smoker  . Smokeless tobacco: Never Used  Substance Use Topics  . Alcohol use: Never    Frequency: Never  . Drug use: Never     Allergies   Patient has no known allergies.   Review of Systems Review of Systems  Constitutional: Negative for  fever.  Respiratory: Negative for shortness of breath.   Cardiovascular: Negative for chest pain.  Gastrointestinal: Positive for abdominal pain and vomiting. Negative for diarrhea and nausea.  Genitourinary: Negative for dysuria.  All other systems reviewed and are negative.    Physical Exam Updated Vital Signs BP (!) 141/91 (BP Location: Right Arm)   Pulse 64   Temp 98 F (36.7 C) (Oral)   Resp 20   Ht 1.829 m (6')   Wt 101.2 kg   SpO2 99%   BMI 30.24 kg/m   Physical Exam Vitals signs and nursing note reviewed.  Constitutional:      Appearance: He is well-developed. He is not ill-appearing.  HENT:     Head: Normocephalic and atraumatic.  Eyes:     Pupils: Pupils are equal, round, and reactive to light.  Neck:     Musculoskeletal: Neck supple.  Cardiovascular:     Rate and Rhythm: Normal rate and regular rhythm.     Heart sounds: Normal heart sounds. No murmur.  Pulmonary:     Effort: Pulmonary effort is normal. No respiratory distress.     Breath sounds: Normal breath sounds. No wheezing.  Abdominal:     General: Bowel sounds are normal.     Palpations: Abdomen is soft.     Tenderness: There is abdominal tenderness in the epigastric area. There is no guarding  or rebound. Negative signs include Murphy's sign.  Lymphadenopathy:     Cervical: No cervical adenopathy.  Skin:    General: Skin is warm and dry.  Neurological:     Mental Status: He is alert and oriented to person, place, and time.  Psychiatric:        Mood and Affect: Mood normal.      ED Treatments / Results  Labs (all labs ordered are listed, but only abnormal results are displayed) Labs Reviewed  CBC WITH DIFFERENTIAL/PLATELET  COMPREHENSIVE METABOLIC PANEL  LIPASE, BLOOD    EKG None  Radiology No results found.  Procedures Procedures (including critical care time)  Medications Ordered in ED Medications  ondansetron (ZOFRAN) injection 4 mg (has no administration in time range)   alum & mag hydroxide-simeth (MAALOX/MYLANTA) 200-200-20 MG/5ML suspension 30 mL (has no administration in time range)     Initial Impression / Assessment and Plan / ED Course  I have reviewed the triage vital signs and the nursing notes.  Pertinent labs & imaging results that were available during my care of the patient were reviewed by me and considered in my medical decision making (see chart for details).        Patient presents with abdominal pain and vomiting.  He is overall comfortable.  Not significantly tender on palpation.  Vital signs reassuring.  Patient given Maalox and Zofran.  Could be gastritis.  Pancreatitis and cholecystitis are also considerations.  Will obtain lab work and reassess.  Final Clinical Impressions(s) / ED Diagnoses   Final diagnoses:  None    ED Discharge Orders    None       Qiara Minetti, Barbette Hair, MD 03/03/19 [phone]

## 2019-03-03 NOTE — ED Triage Notes (Signed)
Pt c/o 9/10 abd pain in epigastric area starting at 5pm 11/2 following eating supper. Vomit x2 Denies nausea. Pt states he has had gall bladder removed.

## 2023-12-31 ENCOUNTER — Other Ambulatory Visit: Payer: Self-pay | Admitting: Orthopedic Surgery

## 2023-12-31 DIAGNOSIS — M5416 Radiculopathy, lumbar region: Secondary | ICD-10-CM

## 2024-01-15 ENCOUNTER — Ambulatory Visit
Admission: RE | Admit: 2024-01-15 | Discharge: 2024-01-15 | Disposition: A | Discharge status: Home / Self Care | Source: Ambulatory Visit | Attending: Orthopedic Surgery | Admitting: Orthopedic Surgery

## 2024-01-15 DIAGNOSIS — M5416 Radiculopathy, lumbar region: Secondary | ICD-10-CM

## 2024-01-29 ENCOUNTER — Other Ambulatory Visit: Payer: Self-pay | Admitting: Orthopedic Surgery

## 2024-01-29 DIAGNOSIS — M5416 Radiculopathy, lumbar region: Secondary | ICD-10-CM

## 2024-02-18 ENCOUNTER — Ambulatory Visit
Admission: RE | Admit: 2024-02-18 | Discharge: 2024-02-18 | Disposition: A | Discharge status: Home / Self Care | Source: Ambulatory Visit | Attending: Orthopedic Surgery | Admitting: Orthopedic Surgery

## 2024-02-18 DIAGNOSIS — M5416 Radiculopathy, lumbar region: Secondary | ICD-10-CM
